# Patient Record
Sex: Female | Born: 1970 | Race: White | Hispanic: No | Smoking: Current every day smoker
Health system: Southern US, Community
[De-identification: ages and names within clinical notes are randomized; demographics above are authoritative.]

## PROBLEM LIST (undated history)

## (undated) ENCOUNTER — Emergency Department (HOSPITAL_COMMUNITY): Admission: EM | Payer: Self-pay | Source: Home / Self Care

## (undated) DIAGNOSIS — R5383 Other fatigue: Secondary | ICD-10-CM

## (undated) DIAGNOSIS — F419 Anxiety disorder, unspecified: Secondary | ICD-10-CM

## (undated) DIAGNOSIS — N939 Abnormal uterine and vaginal bleeding, unspecified: Principal | ICD-10-CM

## (undated) DIAGNOSIS — R232 Flushing: Secondary | ICD-10-CM

## (undated) HISTORY — DX: Other fatigue: R53.83

## (undated) HISTORY — PX: ECTOPIC PREGNANCY SURGERY: SHX613

## (undated) HISTORY — PX: TONSILLECTOMY: SUR1361

## (undated) HISTORY — PX: TUBAL LIGATION: SHX77

## (undated) HISTORY — DX: Flushing: R23.2

## (undated) HISTORY — DX: Abnormal uterine and vaginal bleeding, unspecified: N93.9

## (undated) HISTORY — DX: Anxiety disorder, unspecified: F41.9

---

## 2001-05-14 ENCOUNTER — Emergency Department (HOSPITAL_COMMUNITY): Admission: EM | Admit: 2001-05-14 | Discharge: 2001-05-14 | Payer: Self-pay | Admitting: Internal Medicine

## 2004-06-03 ENCOUNTER — Emergency Department (HOSPITAL_COMMUNITY): Admission: EM | Admit: 2004-06-03 | Discharge: 2004-06-04 | Payer: Self-pay | Admitting: Emergency Medicine

## 2004-06-25 ENCOUNTER — Emergency Department (HOSPITAL_COMMUNITY): Admission: EM | Admit: 2004-06-25 | Discharge: 2004-06-25 | Payer: Self-pay

## 2004-11-14 ENCOUNTER — Emergency Department (HOSPITAL_COMMUNITY): Admission: EM | Admit: 2004-11-14 | Discharge: 2004-11-14 | Payer: Self-pay | Admitting: Emergency Medicine

## 2007-02-04 ENCOUNTER — Emergency Department (HOSPITAL_COMMUNITY): Admission: EM | Admit: 2007-02-04 | Discharge: 2007-02-04 | Payer: Self-pay | Admitting: Emergency Medicine

## 2007-08-17 ENCOUNTER — Emergency Department (HOSPITAL_COMMUNITY): Admission: EM | Admit: 2007-08-17 | Discharge: 2007-08-17 | Payer: Self-pay | Admitting: Emergency Medicine

## 2007-11-22 ENCOUNTER — Emergency Department (HOSPITAL_COMMUNITY): Admission: EM | Admit: 2007-11-22 | Discharge: 2007-11-22 | Payer: Self-pay | Admitting: Emergency Medicine

## 2009-11-10 ENCOUNTER — Emergency Department (HOSPITAL_COMMUNITY): Admission: EM | Admit: 2009-11-10 | Discharge: 2009-11-10 | Payer: Self-pay | Admitting: Emergency Medicine

## 2010-04-02 LAB — URINE CULTURE

## 2010-04-02 LAB — URINALYSIS, ROUTINE W REFLEX MICROSCOPIC
Glucose, UA: NEGATIVE mg/dL
Leukocytes, UA: NEGATIVE
Protein, ur: NEGATIVE mg/dL
Specific Gravity, Urine: 1.025 (ref 1.005–1.030)
pH: 6 (ref 5.0–8.0)

## 2010-04-02 LAB — URINE MICROSCOPIC-ADD ON

## 2010-06-01 ENCOUNTER — Emergency Department (HOSPITAL_COMMUNITY)
Admission: EM | Admit: 2010-06-01 | Discharge: 2010-06-01 | Disposition: A | Discharge status: Home / Self Care | Payer: Self-pay | Attending: Emergency Medicine | Admitting: Emergency Medicine

## 2010-06-01 DIAGNOSIS — R21 Rash and other nonspecific skin eruption: Secondary | ICD-10-CM | POA: Insufficient documentation

## 2010-10-17 LAB — URINALYSIS, ROUTINE W REFLEX MICROSCOPIC
Glucose, UA: NEGATIVE
Nitrite: NEGATIVE
Specific Gravity, Urine: >1.03 — ABNORMAL HIGH
pH: 6

## 2010-10-17 LAB — URINE MICROSCOPIC-ADD ON

## 2012-01-15 ENCOUNTER — Emergency Department (HOSPITAL_COMMUNITY)
Admission: EM | Admit: 2012-01-15 | Discharge: 2012-01-15 | Disposition: A | Discharge status: Home / Self Care | Payer: Self-pay | Attending: Emergency Medicine | Admitting: Emergency Medicine

## 2012-01-15 ENCOUNTER — Encounter (HOSPITAL_COMMUNITY): Payer: Self-pay | Admitting: *Deleted

## 2012-01-15 ENCOUNTER — Emergency Department (HOSPITAL_COMMUNITY): Payer: Self-pay

## 2012-01-15 DIAGNOSIS — R079 Chest pain, unspecified: Secondary | ICD-10-CM

## 2012-01-15 DIAGNOSIS — R071 Chest pain on breathing: Secondary | ICD-10-CM | POA: Insufficient documentation

## 2012-01-15 LAB — CBC WITH DIFFERENTIAL/PLATELET
Basophils Absolute: 0.1 10*3/uL (ref 0.0–0.1)
Basophils Relative: 1 % (ref 0–1)
Eosinophils Absolute: 0 10*3/uL (ref 0.0–0.7)
Hemoglobin: 14.6 g/dL (ref 12.0–15.0)
MCH: 32.9 pg (ref 26.0–34.0)
MCHC: 34.3 g/dL (ref 30.0–36.0)
Monocytes Absolute: 0.7 10*3/uL (ref 0.1–1.0)
Monocytes Relative: 8 % (ref 3–12)
Neutro Abs: 5 10*3/uL (ref 1.7–7.7)
Neutrophils Relative %: 61 % (ref 43–77)
RDW: 12.8 % (ref 11.5–15.5)

## 2012-01-15 LAB — COMPREHENSIVE METABOLIC PANEL
AST: 17 U/L (ref 0–37)
Albumin: 3.6 g/dL (ref 3.5–5.2)
BUN: 14 mg/dL (ref 6–23)
Creatinine, Ser: 0.83 mg/dL (ref 0.50–1.10)
Potassium: 4.2 mEq/L (ref 3.5–5.1)
Total Protein: 6.8 g/dL (ref 6.0–8.3)

## 2012-01-15 LAB — TROPONIN I: Troponin I: <0.3 ng/mL (ref <0.30)

## 2012-01-15 MED ORDER — SODIUM CHLORIDE 0.9 % IV BOLUS (SEPSIS): 1000.0000 mL | Freq: Once | INTRAVENOUS | Status: DC

## 2012-01-15 MED ORDER — KETOROLAC TROMETHAMINE 30 MG/ML IJ SOLN
30.0000 mg | Freq: Once | INTRAMUSCULAR | Status: AC
  Administered 2012-01-15: 30 mg via INTRAVENOUS
  Filled 2012-01-15: qty 1

## 2012-01-15 MED ORDER — ONDANSETRON HCL 4 MG/2ML IJ SOLN: 4.0000 mg | Freq: Once | INTRAMUSCULAR | Status: DC

## 2012-01-15 NOTE — ED Notes (Signed)
Dull pain to chest began last night while taking a bath, intermittent, sharp shooting pains to area also. Pain to left breast area, just under left breast.

## 2012-01-15 NOTE — ED Provider Notes (Signed)
History    This chart was scribed for Geoffery Lyons, MD, MD by Smitty Pluck, ED Scribe. The patient was seen in room APA06 and the patient's care was started at 10:03 AM.   CSN: [id number]  Arrival date & time 01/15/12  0950      Chief Complaint  Patient presents with  . Chest Pain    (Consider location/radiation/quality/duration/timing/severity/associated sxs/prior treatment) The history is provided by the patient. No language interpreter was used.   Gabrielle Barrera is a 41 y.o. female who presents to the Emergency Department complaining of constant, moderate sharp left chest wall pain onset 1 day ago. Pt reports that she was bathing and raised her left arm then chest pain started. Breathing aggravates the pain. Pt denies hx of heart complications, DM, HTN and any other medical problems. She denies cough, SOB, diaphoresis, radiation of pain, fever, chills, nausea, vomiting and any other symptoms.    No past medical history on file.  No past surgical history on file.  No family history on file.  History  Substance Use Topics  . Smoking status: Not on file  . Smokeless tobacco: Not on file  . Alcohol Use: Not on file    OB History    No data available      Review of Systems  All other systems reviewed and are negative.    Allergies  Review of patient's allergies indicates not on file.  Home Medications  No current outpatient prescriptions on file.  BP 111/70  Pulse 92  Temp 97.7 F (36.5 C) (Oral)  Resp 16  Ht 5\' 3"  (1.6 m)  Wt 130 lb (58.968 kg)  BMI 23.03 kg/m2  SpO2 100%  LMP 12/25/2011  Physical Exam  Nursing note and vitals reviewed. Constitutional: She is oriented to person, place, and time. She appears well-developed and well-nourished. No distress.  HENT:  Head: Normocephalic and atraumatic.  Eyes: EOM are normal. Pupils are equal, round, and reactive to light.  Neck: Normal range of motion. Neck supple. No tracheal deviation present.    Cardiovascular: Normal rate, regular rhythm and normal heart sounds.   Pulmonary/Chest: Effort normal. No respiratory distress. She exhibits tenderness (mild left of sternum).  Abdominal: Soft. Bowel sounds are normal. She exhibits no distension. There is no tenderness. There is no rebound and no guarding.  Musculoskeletal: Normal range of motion.  Neurological: She is alert and oriented to person, place, and time.  Skin: Skin is warm and dry.  Psychiatric: She has a normal mood and affect. Her behavior is normal.    ED Course  Procedures (including critical care time) DIAGNOSTIC STUDIES: Oxygen Saturation is 100% on room air, normal by my interpretation.    COORDINATION OF CARE: 10:07 AM Discussed ED treatment with pt  10:07 AM Ordered:  Medications  OVER THE COUNTER MEDICATION (not administered)  ALPRAZolam (XANAX) 1 MG tablet (not administered)  ketorolac (TORADOL) 30 MG/ML injection 30 mg (30 mg Intravenous Given 01/15/12 1016)           Labs Reviewed  COMPREHENSIVE METABOLIC PANEL - Abnormal; Notable for the following:    Glucose, Bld 140 (*)     GFR calc non Af Amer 87 (*)     All other components within normal limits  CBC WITH DIFFERENTIAL  TROPONIN I   Dg Chest 2 View  01/15/2012  *RADIOLOGY REPORT*  Clinical Data: Chest pain, cough.  CHEST - 2 VIEW  Comparison: None.  Findings: Mild hyperinflation. Lungs clear.  Heart  size and pulmonary vascularity normal.  No effusion.  Visualized bones unremarkable.  IMPRESSION: No acute disease   Original Report Authenticated By: D. Andria Rhein, MD      No diagnosis found.   Date: 01/15/2012  Rate: 85  Rhythm: normal sinus rhythm  QRS Axis: normal  Intervals: normal  ST/T Wave abnormalities: nonspecific T wave changes  Conduction Disutrbances:none  Narrative Interpretation:   Old EKG Reviewed: none available    MDM  The workup does not suggest a cardiac etiology and the presentation and exam are more consistent  with a musculoskeletal etiology.  Will discharge with nsaids, return prn.      I personally performed the services described in this documentation, which was scribed in my presence. The recorded information has been reviewed and is accurate.      Geoffery Lyons, MD 01/15/12 1229

## 2013-03-16 ENCOUNTER — Encounter: Payer: Self-pay | Admitting: Adult Health

## 2013-03-22 ENCOUNTER — Encounter (INDEPENDENT_AMBULATORY_CARE_PROVIDER_SITE_OTHER): Payer: Self-pay

## 2013-03-22 ENCOUNTER — Ambulatory Visit (INDEPENDENT_AMBULATORY_CARE_PROVIDER_SITE_OTHER): Payer: BC Managed Care – PPO | Admitting: Adult Health

## 2013-03-22 ENCOUNTER — Encounter: Payer: Self-pay | Admitting: Adult Health

## 2013-03-22 VITALS — BP 110/54 | Ht 63.0 in | Wt 127.0 lb

## 2013-03-22 DIAGNOSIS — R5381 Other malaise: Secondary | ICD-10-CM

## 2013-03-22 DIAGNOSIS — R5383 Other fatigue: Secondary | ICD-10-CM

## 2013-03-22 DIAGNOSIS — N926 Irregular menstruation, unspecified: Secondary | ICD-10-CM

## 2013-03-22 DIAGNOSIS — N939 Abnormal uterine and vaginal bleeding, unspecified: Secondary | ICD-10-CM | POA: Insufficient documentation

## 2013-03-22 DIAGNOSIS — N951 Menopausal and female climacteric states: Secondary | ICD-10-CM

## 2013-03-22 DIAGNOSIS — R232 Flushing: Secondary | ICD-10-CM | POA: Insufficient documentation

## 2013-03-22 HISTORY — DX: Other fatigue: R53.83

## 2013-03-22 HISTORY — DX: Flushing: R23.2

## 2013-03-22 HISTORY — DX: Abnormal uterine and vaginal bleeding, unspecified: N93.9

## 2013-03-22 LAB — CBC
HCT: 42.5 % (ref 36.0–46.0)
Hemoglobin: 14.4 g/dL (ref 12.0–15.0)
MCH: 31.5 pg (ref 26.0–34.0)
MCHC: 33.9 g/dL (ref 30.0–36.0)
MCV: 93 fL (ref 78.0–100.0)
PLATELETS: 230 10*3/uL (ref 150–400)
RBC: 4.57 MIL/uL (ref 3.87–5.11)
RDW: 13.1 % (ref 11.5–15.5)
WBC: 7 10*3/uL (ref 4.0–10.5)

## 2013-03-22 LAB — COMPREHENSIVE METABOLIC PANEL
ALK PHOS: 41 U/L (ref 39–117)
ALT: 13 U/L (ref 0–35)
AST: 18 U/L (ref 0–37)
Albumin: 4.6 g/dL (ref 3.5–5.2)
BILIRUBIN TOTAL: 0.5 mg/dL (ref 0.2–1.2)
BUN: 18 mg/dL (ref 6–23)
CO2: 28 mEq/L (ref 19–32)
CREATININE: 0.82 mg/dL (ref 0.50–1.10)
Calcium: 9.7 mg/dL (ref 8.4–10.5)
Chloride: 105 mEq/L (ref 96–112)
Glucose, Bld: 79 mg/dL (ref 70–99)
Potassium: 4 mEq/L (ref 3.5–5.3)
SODIUM: 141 meq/L (ref 135–145)
TOTAL PROTEIN: 6.9 g/dL (ref 6.0–8.3)

## 2013-03-22 NOTE — Progress Notes (Signed)
Subjective:     Patient ID: Gabrielle Barrera A Mcghee, female   DOB: Feb 07, 1970, 43 y.o.   MRN: [id number]  HPI Gabrielle Barrera is a 43 year old white female in complaining of fatigue,hot flashes and history of having 2 periods per month for 6-7 months then no period x 2 months then had heavy period then LMP was 19 days long.Some cramps,has had BTL and is having sex.Has not had Pap in long time.She is smoker.   Review of Systems See HPI Reviewed past medical,surgical, social and family history. Reviewed medications and allergies.     Objective:   Physical Exam BP 110/54  Ht 5\' 3"  (1.6 m)  Wt 127 lb (57.607 kg)  BMI 22.50 kg/m2  LMP 02/25/2013   Skin warm and dry.Pelvic: external genitalia is normal in appearance, vagina: scant discharge without odor, no bleeding today, cervix:smooth and bulbous, uterus:anterior, normal size, shape and contour, non tender, no masses felt, adnexa: no masses or tenderness noted.  Discussed could be menopause, but will need US and labs first, will schedule pap after US results back.  Assessment:     AUB Hot flashes Fatigue     Plan:    Check CBC,CMP,TSH and FSH Return in 1 week for US and see me Review handout on DUB and menopause   Will talk in 24-48 hours about labs

## 2013-03-22 NOTE — Patient Instructions (Signed)
Dysfunctional Uterine Bleeding Normally, menstrual periods begin between ages 11 to 17 in young women. A normal menstrual cycle/period may begin every 23 days up to 35 days and lasts from 1 to 7 days. Around 12 to 14 days before your menstrual period starts, ovulation (ovary produces an egg) occurs. When counting the time between menstrual periods, count from the first day of bleeding of the previous period to the first day of bleeding of the next period. Dysfunctional (abnormal) uterine bleeding is bleeding that is different from a normal menstrual period. Your periods may come earlier or later than usual. They may be lighter, have blood clots or be heavier. You may have bleeding between periods, or you may skip one period or more. You may have bleeding after sexual intercourse, bleeding after menopause, or no menstrual period. CAUSES   Pregnancy (normal, miscarriage, tubal).  IUDs (intrauterine device, birth control).  Birth control pills.  Hormone treatment.  Menopause.  Infection of the cervix.  Blood clotting problems.  Infection of the inside lining of the uterus.  Endometriosis, inside lining of the uterus growing in the pelvis and other female organs.  Adhesions (scar tissue) inside the uterus.  Obesity or severe weight loss.  Uterine polyps inside the uterus.  Cancer of the vagina, cervix, or uterus.  Ovarian cysts or polycystic ovary syndrome.  Medical problems (diabetes, thyroid disease).  Uterine fibroids (noncancerous tumor).  Problems with your female hormones.  Endometrial hyperplasia, very thick lining and enlarged cells inside of the uterus.  Medicines that interfere with ovulation.  Radiation to the pelvis or abdomen.  Chemotherapy. DIAGNOSIS   Your doctor will discuss the history of your menstrual periods, medicines you are taking, changes in your weight, stress in your life, and any medical problems you may have.  Your doctor will do a physical  and pelvic examination.  Your doctor may want to perform certain tests to make a diagnosis, such as:  Pap test.  Blood tests.  Cultures for infection.  CT scan.  Ultrasound.  Hysteroscopy.  Laparoscopy.  MRI.  Hysterosalpingography.  D and C.  Endometrial biopsy. TREATMENT  Treatment will depend on the cause of the dysfunctional uterine bleeding (DUB). Treatment may include:  Observing your menstrual periods for a couple of months.  Prescribing medicines for medical problems, including:  Antibiotics.  Hormones.  Birth control pills.  Removing an IUD (intrauterine device, birth control).  Surgery:  D and C (scrape and remove tissue from inside the uterus).  Laparoscopy (examine inside the abdomen with a lighted tube).  Uterine ablation (destroy lining of the uterus with electrical current, laser, heat, or freezing).  Hysteroscopy (examine cervix and uterus with a lighted tube).  Hysterectomy (remove the uterus). HOME CARE INSTRUCTIONS   If medicines were prescribed, take exactly as directed. Do not change or switch medicines without consulting your caregiver.  Long term heavy bleeding may result in iron deficiency. Your caregiver may have prescribed iron pills. They help replace the iron that your body lost from heavy bleeding. Take exactly as directed.  Do not take aspirin or medicines that contain aspirin one week before or during your menstrual period. Aspirin may make the bleeding worse.  If you need to change your sanitary pad or tampon more than once every 2 hours, stay in bed with your feet elevated and a cold pack on your lower abdomen. Rest as much as possible, until the bleeding stops or slows down.  Eat well-balanced meals. Eat foods high in iron. Examples   are:  Leafy green vegetables.  Whole-grain breads and cereals.  Eggs.  Meat.  Liver.  Do not try to lose weight until the abnormal bleeding has stopped and your blood iron level is  back to normal. Do not lift more than ten pounds or do strenuous activities when you are bleeding.  For a couple of months, make note on your calendar, marking the start and ending of your period, and the type of bleeding (light, medium, heavy, spotting, clots or missed periods). This is for your caregiver to better evaluate your problem. SEEK MEDICAL CARE IF:   You develop nausea (feeling sick to your stomach) and vomiting, dizziness, or diarrhea while you are taking your medicine.  You are getting lightheaded or weak.  You have any problems that may be related to the medicine you are taking.  You develop pain with your DUB.  You want to remove your IUD.  You want to stop or change your birth control pills or hormones.  You have any type of abnormal bleeding mentioned above.  You are over 42 years old and have not had a menstrual period yet.  You are 43 years old and you are still having menstrual periods.  You have any of the symptoms mentioned above.  You develop a rash. SEEK IMMEDIATE MEDICAL CARE IF:   An oral temperature above 102 F (38.9 C) develops.  You develop chills.  You are changing your sanitary pad or tampon more than once an hour.  You develop abdominal pain.  You pass out or faint. Document Released: 01/03/2000 Document Revised: 03/30/2011 Document Reviewed: 12/04/2008 [facility] Patient Information 2014 [place], [place]. Menopause Menopause is the normal time of life when menstrual periods stop completely. Menopause is complete when you have missed 12 consecutive menstrual periods. It usually occurs between the ages of 48 years and 55 years. Very rarely does a woman develop menopause before the age of 40 years. At menopause, your ovaries stop producing the female hormones estrogen and progesterone. This can cause undesirable symptoms and also affect your health. Sometimes the symptoms may occur 4 5 years before the menopause begins. There is no relationship  between menopause and:  Oral contraceptives.  Number of children you had.  Race.  The age your menstrual periods started (menarche). Heavy smokers and very thin women may develop menopause earlier in life. CAUSES  The ovaries stop producing the female hormones estrogen and progesterone.  Other causes include:  Surgery to remove both ovaries.  The ovaries stop functioning for no known reason.  Tumors of the pituitary gland in the brain.  Medical disease that affects the ovaries and hormone production.  Radiation treatment to the abdomen or pelvis.  Chemotherapy that affects the ovaries. SYMPTOMS   Hot flashes.  Night sweats.  Decrease in sex drive.  Vaginal dryness and thinning of the vagina causing painful intercourse.  Dryness of the skin and developing wrinkles.  Headaches.  Tiredness.  Irritability.  Memory problems.  Weight gain.  Bladder infections.  Hair growth of the face and chest.  Infertility. More serious symptoms include:  Loss of bone (osteoporosis) causing breaks (fractures).  Depression.  Hardening and narrowing of the arteries (atherosclerosis) causing heart attacks and strokes. DIAGNOSIS   When the menstrual periods have stopped for 12 straight months.  Physical exam.  Hormone studies of the blood. TREATMENT  There are many treatment choices and nearly as many questions about them. The decisions to treat or not to treat menopausal changes is an  individual choice made with your health care provider. Your health care provider can discuss the treatments with you. Together, you can decide which treatment will work best for you. Your treatment choices may include:   Hormone therapy (estrogen and progesterone).  Non-hormonal medicines.  Treating the individual symptoms with medicine (for example antidepressants for depression).  Herbal medicines that may help specific symptoms.  Counseling by a psychiatrist or  psychologist.  Group therapy.  Lifestyle changes including:  Eating healthy.  Regular exercise.  Limiting caffeine and alcohol.  Stress management and meditation.  No treatment. HOME CARE INSTRUCTIONS   Take the medicine your health care provider gives you as directed.  Get plenty of sleep and rest.  Exercise regularly.  Eat a diet that contains calcium (good for the bones) and soy products (acts like estrogen hormone).  Avoid alcoholic beverages.  Do not smoke.  If you have hot flashes, dress in layers.  Take supplements, calcium, and vitamin D to strengthen bones.  You can use over-the-counter lubricants or moisturizers for vaginal dryness.  Group therapy is sometimes very helpful.  Acupuncture may be helpful in some cases. SEEK MEDICAL CARE IF:   You are not sure you are in menopause.  You are having menopausal symptoms and need advice and treatment.  You are still having menstrual periods after age 42 years.  You have pain with intercourse.  Menopause is complete (no menstrual period for 12 months) and you develop vaginal bleeding.  You need a referral to a specialist (gynecologist, psychiatrist, or psychologist) for treatment. SEEK IMMEDIATE MEDICAL CARE IF:   You have severe depression.  You have excessive vaginal bleeding.  You fell and think you have a broken bone.  You have pain when you urinate.  You develop leg or chest pain.  You have a fast pounding heart beat (palpitations).  You have severe headaches.  You develop vision problems.  You feel a lump in your breast.  You have abdominal pain or severe indigestion. Document Released: 03/28/2003 Document Revised: 09/07/2012 Document Reviewed: 08/04/2012 [facility] Patient Information 2014 [place]. Return in 1 week for US and see me Call for labs

## 2013-03-23 ENCOUNTER — Telehealth: Payer: Self-pay | Admitting: Adult Health

## 2013-03-23 LAB — TSH: TSH: 1.062 u[IU]/mL (ref 0.350–4.500)

## 2013-03-23 LAB — FOLLICLE STIMULATING HORMONE: FSH: 73.7 m[IU]/mL

## 2013-03-23 NOTE — Telephone Encounter (Signed)
Pt aware of labs and that FSH 73.7, keep appt next week for US, will discuss more then

## 2013-03-29 ENCOUNTER — Encounter: Payer: Self-pay | Admitting: Adult Health

## 2013-03-29 ENCOUNTER — Ambulatory Visit (INDEPENDENT_AMBULATORY_CARE_PROVIDER_SITE_OTHER): Payer: BC Managed Care – PPO | Admitting: Adult Health

## 2013-03-29 ENCOUNTER — Ambulatory Visit (INDEPENDENT_AMBULATORY_CARE_PROVIDER_SITE_OTHER): Payer: BC Managed Care – PPO

## 2013-03-29 VITALS — BP 100/70 | Ht 63.0 in | Wt 126.0 lb

## 2013-03-29 DIAGNOSIS — N926 Irregular menstruation, unspecified: Secondary | ICD-10-CM

## 2013-03-29 DIAGNOSIS — N939 Abnormal uterine and vaginal bleeding, unspecified: Secondary | ICD-10-CM

## 2013-03-29 MED ORDER — MEGESTROL ACETATE 40 MG PO TABS: ORAL_TABLET | ORAL | Status: DC

## 2013-03-29 NOTE — Progress Notes (Signed)
Subjective:     Patient ID: Gabrielle Barrera, female   DOB: [birth year], 43 y.o.   MRN: [id number]  HPI Gabrielle Barrera is a 43 year old white female back for US for AUB.  Review of Systems See HPI Reviewed past medical,surgical, social and family history. Reviewed medications and allergies.     Objective:   Physical Exam BP 100/70  Ht 5\' 3"  (1.6 m)  Wt 126 lb (57.153 kg)  BMI 22.33 kg/m2  LMP 02/25/2013   Reviewed US with pt. Uterus 7.9 x 4.6 x 4.0 cm, no myometrial masses noted, Nabothian cyst noted = 10.258mm  Endometrium 3.5 mm, symmetrical,  Right ovary 1.7 x 1.2 x 1.0 cm,  Left ovary 2.6 x 1.6 x 1.5 cm,  No free fluid or adnexal masses noted abdominally or vaginally  Technician Comments:  Anteverted uterus noted, 10+mm Nabothian cyst noted, Endom=3.875mm, bilateral adnexa/ovaries appears WNL. FSH 73.7,TSH 1.062 and CBC normal.Pt aware that she is perimenopausal.  Assessment:     AUB    Plan:     Rx megace 40 mg 1 daily, disp #30 with 3 refills  Return in 4 weeks for pap and physical.

## 2013-03-29 NOTE — Patient Instructions (Signed)
Take megace 1 daily Return in 4 weeks for pap and physcial

## 2013-04-13 ENCOUNTER — Encounter (HOSPITAL_COMMUNITY): Payer: Self-pay | Admitting: Emergency Medicine

## 2013-04-13 DIAGNOSIS — Z79899 Other long term (current) drug therapy: Secondary | ICD-10-CM | POA: Insufficient documentation

## 2013-04-13 DIAGNOSIS — L509 Urticaria, unspecified: Secondary | ICD-10-CM | POA: Insufficient documentation

## 2013-04-13 DIAGNOSIS — N390 Urinary tract infection, site not specified: Secondary | ICD-10-CM | POA: Insufficient documentation

## 2013-04-13 DIAGNOSIS — F17200 Nicotine dependence, unspecified, uncomplicated: Secondary | ICD-10-CM | POA: Insufficient documentation

## 2013-04-13 NOTE — ED Notes (Signed)
Onset of hives yesterday, unknown allergen

## 2013-04-14 ENCOUNTER — Emergency Department (HOSPITAL_COMMUNITY)
Admission: EM | Admit: 2013-04-14 | Discharge: 2013-04-14 | Disposition: A | Discharge status: Home / Self Care | Payer: BC Managed Care – PPO | Attending: Emergency Medicine | Admitting: Emergency Medicine

## 2013-04-14 DIAGNOSIS — L509 Urticaria, unspecified: Secondary | ICD-10-CM

## 2013-04-14 DIAGNOSIS — N390 Urinary tract infection, site not specified: Secondary | ICD-10-CM

## 2013-04-14 LAB — URINE MICROSCOPIC-ADD ON

## 2013-04-14 LAB — URINALYSIS, ROUTINE W REFLEX MICROSCOPIC
BILIRUBIN URINE: NEGATIVE
Glucose, UA: NEGATIVE mg/dL
Ketones, ur: NEGATIVE mg/dL
Leukocytes, UA: NEGATIVE
NITRITE: POSITIVE — AB
PH: 6 (ref 5.0–8.0)
Protein, ur: NEGATIVE mg/dL
SPECIFIC GRAVITY, URINE: 1.025 (ref 1.005–1.030)
Urobilinogen, UA: 0.2 mg/dL (ref 0.0–1.0)

## 2013-04-14 MED ORDER — PREDNISONE 10 MG PO TABS
ORAL_TABLET | ORAL | Status: AC
  Filled 2013-04-14: qty 1

## 2013-04-14 MED ORDER — FAMOTIDINE 20 MG PO TABS
20.0000 mg | ORAL_TABLET | Freq: Once | ORAL | Status: AC
  Administered 2013-04-14: 20 mg via ORAL

## 2013-04-14 MED ORDER — CEPHALEXIN 500 MG PO CAPS: 500.0000 mg | ORAL_CAPSULE | Freq: Four times a day (QID) | ORAL | Status: DC

## 2013-04-14 MED ORDER — FAMOTIDINE 20 MG PO TABS
ORAL_TABLET | ORAL | Status: AC
  Administered 2013-04-14: 20 mg via ORAL
  Filled 2013-04-14: qty 1

## 2013-04-14 MED ORDER — PREDNISONE 50 MG PO TABS
60.0000 mg | ORAL_TABLET | Freq: Once | ORAL | Status: AC
  Administered 2013-04-14: 60 mg via ORAL

## 2013-04-14 MED ORDER — CEPHALEXIN 500 MG PO CAPS
ORAL_CAPSULE | ORAL | Status: AC
  Filled 2013-04-14: qty 2

## 2013-04-14 MED ORDER — CEPHALEXIN 500 MG PO CAPS
1000.0000 mg | ORAL_CAPSULE | Freq: Once | ORAL | Status: AC
  Administered 2013-04-14: 1000 mg via ORAL

## 2013-04-14 MED ORDER — PREDNISONE 50 MG PO TABS
ORAL_TABLET | ORAL | Status: AC
  Filled 2013-04-14: qty 1

## 2013-04-14 MED ORDER — PREDNISONE 50 MG PO TABS: 50.0000 mg | ORAL_TABLET | Freq: Every day | ORAL | Status: DC

## 2013-04-14 NOTE — ED Notes (Signed)
Pt alert & oriented x4, stable gait. Patient given discharge instructions, paperwork & prescription(s). Patient  instructed to stop at the registration desk to finish any additional paperwork. Patient verbalized understanding. Pt left department w/ no further questions. 

## 2013-04-14 NOTE — Discharge Instructions (Signed)
Take Claritin or Zyrtec once a day. Take Zantac or Pepcid twice a day. Take Benadryl as needed for itching not controlled by the other medication.  Hives Hives are itchy, red, swollen areas of the skin. They can vary in size and location on your body. Hives can come and go for hours or several days (acute hives) or for several weeks (chronic hives). Hives do not spread from person to person (noncontagious). They may get worse with scratching, exercise, and emotional stress. CAUSES   Allergic reaction to food, additives, or drugs.  Infections, including the common cold.  Illness, such as vasculitis, lupus, or thyroid disease.  Exposure to sunlight, heat, or cold.  Exercise.  Stress.  Contact with chemicals. SYMPTOMS   Red or white swollen patches on the skin. The patches may change size, shape, and location quickly and repeatedly.  Itching.  Swelling of the hands, feet, and face. This may occur if hives develop deeper in the skin. DIAGNOSIS  Your caregiver can usually tell what is wrong by performing a physical exam. Skin or blood tests may also be done to determine the cause of your hives. In some cases, the cause cannot be determined. TREATMENT  Mild cases usually get better with medicines such as antihistamines. Severe cases may require an emergency epinephrine injection. If the cause of your hives is known, treatment includes avoiding that trigger.  HOME CARE INSTRUCTIONS   Avoid causes that trigger your hives.  Take antihistamines as directed by your caregiver to reduce the severity of your hives. Non-sedating or low-sedating antihistamines are usually recommended. Do not drive while taking an antihistamine.  Take any other medicines prescribed for itching as directed by your caregiver.  Wear loose-fitting clothing.  Keep all follow-up appointments as directed by your caregiver. SEEK MEDICAL CARE IF:   You have persistent or severe itching that is not relieved with  medicine.  You have painful or swollen joints. SEEK IMMEDIATE MEDICAL CARE IF:   You have a fever.  Your tongue or lips are swollen.  You have trouble breathing or swallowing.  You feel tightness in the throat or chest.  You have abdominal pain. These problems may be the first sign of a life-threatening allergic reaction. Call your local emergency services (911 in [place] MAKE SURE YOU:   Understand these instructions.  Will watch your condition.  Will get help right away if you are not doing well or get worse. Document Released: 01/05/2005 Document Revised: 07/07/2011 Document Reviewed: 03/31/2011 [facility] Patient Information 2014 [place], [place].  Urinary Tract Infection Urinary tract infections (UTIs) can develop anywhere along your urinary tract. Your urinary tract is your body's drainage system for removing wastes and extra water. Your urinary tract includes two kidneys, two ureters, a bladder, and a urethra. Your kidneys are a pair of bean-shaped organs. Each kidney is about the size of your fist. They are located below your ribs, one on each side of your spine. CAUSES Infections are caused by microbes, which are microscopic organisms, including fungi, viruses, and bacteria. These organisms are so small that they can only be seen through a microscope. Bacteria are the microbes that most commonly cause UTIs. SYMPTOMS  Symptoms of UTIs may vary by age and gender of the patient and by the location of the infection. Symptoms in young women typically include a frequent and intense urge to urinate and a painful, burning feeling in the bladder or urethra during urination. Older women and men are more likely to be tired, shaky,  and weak and have muscle aches and abdominal pain. A fever may mean the infection is in your kidneys. Other symptoms of a kidney infection include pain in your back or sides below the ribs, nausea, and vomiting. DIAGNOSIS To diagnose a UTI, your caregiver will ask  you about your symptoms. Your caregiver also will ask to provide a urine sample. The urine sample will be tested for bacteria and white blood cells. White blood cells are made by your body to help fight infection. TREATMENT  Typically, UTIs can be treated with medication. Because most UTIs are caused by a bacterial infection, they usually can be treated with the use of antibiotics. The choice of antibiotic and length of treatment depend on your symptoms and the type of bacteria causing your infection. HOME CARE INSTRUCTIONS  If you were prescribed antibiotics, take them exactly as your caregiver instructs you. Finish the medication even if you feel better after you have only taken some of the medication.  Drink enough water and fluids to keep your urine clear or pale yellow.  Avoid caffeine, tea, and carbonated beverages. They tend to irritate your bladder.  Empty your bladder often. Avoid holding urine for long periods of time.  Empty your bladder before and after sexual intercourse.  After a bowel movement, women should cleanse from front to back. Use each tissue only once. SEEK MEDICAL CARE IF:   You have back pain.  You develop a fever.  Your symptoms do not begin to resolve within 3 days. SEEK IMMEDIATE MEDICAL CARE IF:   You have severe back pain or lower abdominal pain.  You develop chills.  You have nausea or vomiting.  You have continued burning or discomfort with urination. MAKE SURE YOU:   Understand these instructions.  Will watch your condition.  Will get help right away if you are not doing well or get worse. Document Released: 10/15/2004 Document Revised: 07/07/2011 Document Reviewed: 02/13/2011 [facility] Patient Information 2014 [place], [place].  Prednisone tablets What is this medicine? PREDNISONE (PRED ni sone) is a corticosteroid. It is commonly used to treat inflammation of the skin, joints, lungs, and other organs. Common conditions treated include  asthma, allergies, and arthritis. It is also used for other conditions, such as blood disorders and diseases of the adrenal glands. This medicine may be used for other purposes; ask your health care provider or pharmacist if you have questions. COMMON BRAND NAME(S): Deltasone, Predone, Sterapred DS, Sterapred What should I tell my health care provider before I take this medicine? They need to know if you have any of these conditions: -Cushing's syndrome -diabetes -glaucoma -heart disease -high blood pressure -infection (especially a virus infection such as chickenpox, cold sores, or herpes) -kidney disease -liver disease -mental illness -myasthenia gravis -osteoporosis -seizures -stomach or intestine problems -thyroid disease -an unusual or allergic reaction to lactose, prednisone, other medicines, foods, dyes, or preservatives -pregnant or trying to get pregnant -breast-feeding How should I use this medicine? Take this medicine by mouth with a glass of water. Follow the directions on the prescription label. Take this medicine with food. If you are taking this medicine once a day, take it in the morning. Do not take more medicine than you are told to take. Do not suddenly stop taking your medicine because you may develop a severe reaction. Your doctor will tell you how much medicine to take. If your doctor wants you to stop the medicine, the dose may be slowly lowered over time to avoid any side  effects. Talk to your pediatrician regarding the use of this medicine in children. Special care may be needed. Overdosage: If you think you have taken too much of this medicine contact a poison control center or emergency room at once. NOTE: This medicine is only for you. Do not share this medicine with others. What if I miss a dose? If you miss a dose, take it as soon as you can. If it is almost time for your next dose, talk to your doctor or health care professional. You may need to miss a dose  or take an extra dose. Do not take double or extra doses without advice. What may interact with this medicine? Do not take this medicine with any of the following medications: -metyrapone -mifepristone This medicine may also interact with the following medications: -aminoglutethimide -amphotericin B -aspirin and aspirin-like medicines -barbiturates -certain medicines for diabetes, like glipizide or glyburide -cholestyramine -cholinesterase inhibitors -cyclosporine -digoxin -diuretics -ephedrine -female hormones, like estrogens and birth control pills -isoniazid -ketoconazole -NSAIDS, medicines for pain and inflammation, like ibuprofen or naproxen -phenytoin -rifampin -toxoids -vaccines -warfarin This list may not describe all possible interactions. Give your health care provider a list of all the medicines, herbs, non-prescription drugs, or dietary supplements you use. Also tell them if you smoke, drink alcohol, or use illegal drugs. Some items may interact with your medicine. What should I watch for while using this medicine? Visit your doctor or health care professional for regular checks on your progress. If you are taking this medicine over a prolonged period, carry an identification card with your name and address, the type and dose of your medicine, and your doctor's name and address. This medicine may increase your risk of getting an infection. Tell your doctor or health care professional if you are around anyone with measles or chickenpox, or if you develop sores or blisters that do not heal properly. If you are going to have surgery, tell your doctor or health care professional that you have taken this medicine within the last twelve months. Ask your doctor or health care professional about your diet. You may need to lower the amount of salt you eat. This medicine may affect blood sugar levels. If you have diabetes, check with your doctor or health care professional before  you change your diet or the dose of your diabetic medicine. What side effects may I notice from receiving this medicine? Side effects that you should report to your doctor or health care professional as soon as possible: -allergic reactions like skin rash, itching or hives, swelling of the face, lips, or tongue -changes in emotions or moods -changes in vision -depressed mood -eye pain -fever or chills, cough, sore throat, pain or difficulty passing urine -increased thirst -swelling of ankles, feet Side effects that usually do not require medical attention (report to your doctor or health care professional if they continue or are bothersome): -confusion, excitement, restlessness -headache -nausea, vomiting -skin problems, acne, thin and shiny skin -trouble sleeping -weight gain This list may not describe all possible side effects. Call your doctor for medical advice about side effects. You may report side effects to FDA at 1-800-FDA-1088. Where should I keep my medicine? Keep out of the reach of children. Store at room temperature between 15 and 30 degrees C (59 and 86 degrees F). Protect from light. Keep container tightly closed. Throw away any unused medicine after the expiration date. NOTE: This sheet is a summary. It may not cover all possible information. If you  have questions about this medicine, talk to your doctor, pharmacist, or health care provider.  2014, Elsevier/[employer] Standard. (2010-08-21 10:57:14)  Cephalexin tablets or capsules What is this medicine? CEPHALEXIN (sef a LEX in) is a cephalosporin antibiotic. It is used to treat certain kinds of bacterial infections It will not work for colds, flu, or other viral infections. This medicine may be used for other purposes; ask your health care provider or pharmacist if you have questions. COMMON BRAND NAME(S): Biocef, Keflex, Keftab What should I tell my health care provider before I take this medicine? They need to know if  you have any of these conditions: -kidney disease -stomach or intestine problems, especially colitis -an unusual or allergic reaction to cephalexin, other cephalosporins, penicillins, other antibiotics, medicines, foods, dyes or preservatives -pregnant or trying to get pregnant -breast-feeding How should I use this medicine? Take this medicine by mouth with a full glass of water. Follow the directions on the prescription label. This medicine can be taken with or without food. Take your medicine at regular intervals. Do not take your medicine more often than directed. Take all of your medicine as directed even if you think you are better. Do not skip doses or stop your medicine early. Talk to your pediatrician regarding the use of this medicine in children. While this drug may be prescribed for selected conditions, precautions do apply. Overdosage: If you think you have taken too much of this medicine contact a poison control center or emergency room at once. NOTE: This medicine is only for you. Do not share this medicine with others. What if I miss a dose? If you miss a dose, take it as soon as you can. If it is almost time for your next dose, take only that dose. Do not take double or extra doses. There should be at least 4 to 6 hours between doses. What may interact with this medicine? -probenecid -some other antibiotics This list may not describe all possible interactions. Give your health care provider a list of all the medicines, herbs, non-prescription drugs, or dietary supplements you use. Also tell them if you smoke, drink alcohol, or use illegal drugs. Some items may interact with your medicine. What should I watch for while using this medicine? Tell your doctor or health care professional if your symptoms do not begin to improve in a few days. Do not treat diarrhea with over the counter products. Contact your doctor if you have diarrhea that lasts more than 2 days or if it is severe and  watery. If you have diabetes, you may get a false-positive result for sugar in your urine. Check with your doctor or health care professional. What side effects may I notice from receiving this medicine? Side effects that you should report to your doctor or health care professional as soon as possible: -allergic reactions like skin rash, itching or hives, swelling of the face, lips, or tongue -breathing problems -pain or trouble passing urine -redness, blistering, peeling or loosening of the skin, including inside the mouth -severe or watery diarrhea -unusually weak or tired -yellowing of the eyes, skin Side effects that usually do not require medical attention (report to your doctor or health care professional if they continue or are bothersome): -gas or heartburn -genital or anal irritation -headache -joint or muscle pain -nausea, vomiting This list may not describe all possible side effects. Call your doctor for medical advice about side effects. You may report side effects to FDA at 1-800-FDA-1088. Where should I keep  my medicine? Keep out of the reach of children. Store at room temperature between 59 and 86 degrees F (15 and 30 degrees C). Throw away any unused medicine after the expiration date. NOTE: This sheet is a summary. It may not cover all possible information. If you have questions about this medicine, talk to your doctor, pharmacist, or health care provider.  2014, Elsevier/Gold Standard. (2007-04-11 17:09:13)

## 2013-04-14 NOTE — ED Provider Notes (Signed)
CSN: [id number]     Arrival date & time 04/13/13  2301 History   First MD Initiated Contact with Patient 04/14/13 0244     Chief Complaint  Patient presents with  . Urticaria     (Consider location/radiation/quality/duration/timing/severity/associated sxs/prior Treatment) Patient is a 43 y.o. female presenting with urticaria. The history is provided by the patient.  Urticaria  She started developing hives yesterday and has been getting progressively worse. Hives are present over entire body. She is complaining of severe itching. She denies any difficulty breathing or swallowing. She took diphenhydramine which gave temporary relief but to also put her to sleep. She denies any new medications, laundry detergents, fabric softeners, deodorants, cosmetics, foods, pets, etc. She had one prior episode of urticaria for which she felt was related to stress. She is also concerned about possible UTI. She's had some pain in her lower back but denie  urinary urgency, frequency, tenesmus, dysuria. However, back pain has been a presenting symptom of UTI in the past. Past Medical History  Diagnosis Date  . Abnormal uterine bleeding (AUB) 03/22/2013  . Hot flashes 03/22/2013  . Fatigue 03/22/2013   Past Surgical History  Procedure Laterality Date  . Tonsillectomy    . Cesarean section    . Ectopic pregnancy surgery    . Tubal ligation     Family History  Problem Relation Age of Onset  . Cancer Mother     skin   . Cancer Maternal Grandmother    History  Substance Use Topics  . Smoking status: Current Every Day Smoker -- 1.00 packs/day    Types: Cigarettes  . Smokeless tobacco: Never Used  . Alcohol Use: Yes     Comment: Occ   OB History   Grav Para Term Preterm Abortions TAB SAB Ect Mult Living   3 3        3      Review of Systems  All other systems reviewed and are negative.      Allergies  Review of patient's allergies indicates no known allergies.  Home Medications   Current  Outpatient Rx  Name  Route  Sig  Dispense  Refill  . ALPRAZolam (XANAX) 1 MG tablet   Oral   Take 0.5 mg by mouth daily as needed. For anxiety         . megestrol (MEGACE) 40 MG tablet      Take 1 daily   30 tablet   3   . OVER THE COUNTER MEDICATION   Oral   Take 1 tablet by mouth daily. Women's one a day multi vitamin          BP 113/59  Pulse 90  Temp(Src) 98.1 F (36.7 C) (Oral)  Resp 16  Ht 5\' 3"  (1.6 m)  Wt 127 lb (57.607 kg)  BMI 22.50 kg/m2  SpO2 98%  LMP 02/25/2013 Physical Exam  Nursing note and vitals reviewed.  43 year old female, resting comfortably and in no acute distress. Vital signs are  normal. Oxygen saturation is 98%, which is normal. Head is normocephalic and atraumatic. PERRLA, EOMI. Oropharynx is clear. there are no mucosal lesions. Neck is nontender and supple without adenopathy or JVD. Back is nontender and there is no CVA tenderness. Lungs are clear without rales, wheezes, or rhonchi. Chest is nontender. Heart has regular rate and rhythm without murmur. Abdomen is soft, flat, nontender without masses or hepatosplenomegaly and peristalsis is normoactive. Extremities have no cyanosis or edema, full range of motion is  present. Skin is warm and dry. Diffuse urticarial rash is present. Neurologic: Mental status is normal, cranial nerves are intact, there are no motor or sensory deficits.  ED Course  Procedures (including critical care time) Labs Review Results for orders placed during the hospital encounter of 04/14/13  URINALYSIS, ROUTINE W REFLEX MICROSCOPIC      Result Value Ref Range   Color, Urine YELLOW  YELLOW   APPearance HAZY (*) CLEAR   Specific Gravity, Urine 1.025  1.005 - 1.030   pH 6.0  5.0 - 8.0   Glucose, UA NEGATIVE  NEGATIVE mg/dL   Hgb urine dipstick TRACE (*) NEGATIVE   Bilirubin Urine NEGATIVE  NEGATIVE   Ketones, ur NEGATIVE  NEGATIVE mg/dL   Protein, ur NEGATIVE  NEGATIVE mg/dL   Urobilinogen, UA 0.2  0.0 - 1.0  mg/dL   Nitrite POSITIVE (*) NEGATIVE   Leukocytes, UA NEGATIVE  NEGATIVE  URINE MICROSCOPIC-ADD ON      Result Value Ref Range   Squamous Epithelial / LPF FEW (*) RARE   WBC, UA 3-6  <3 WBC/hpf   RBC / HPF 3-6  <3 RBC/hpf   Bacteria, UA MANY (*) RARE   MDM   Final diagnoses:  Urticaria  Urinary tract infection    Urticaria of uncertain cause. Urinalysis will be obtained to rule out UTI and she will be discharged with prescriptions for prednisone. She is advised to use over-the-counter H2 blocker such as ranitidine or famotidine and also non-sedating antihistamines such as certrizine or loratidine.  Urinalysis confirms the presence of urinary tract infection with positive nitrite. She is also given prescription for cephalexin.  Dione Booze, MD 04/14/13 [id number]

## 2013-04-26 ENCOUNTER — Other Ambulatory Visit (HOSPITAL_COMMUNITY)
Admission: RE | Admit: 2013-04-26 | Discharge: 2013-04-26 | Disposition: A | Discharge status: Home / Self Care | Payer: BC Managed Care – PPO | Source: Ambulatory Visit | Attending: Adult Health | Admitting: Adult Health

## 2013-04-26 ENCOUNTER — Ambulatory Visit (INDEPENDENT_AMBULATORY_CARE_PROVIDER_SITE_OTHER): Payer: BC Managed Care – PPO | Admitting: Adult Health

## 2013-04-26 ENCOUNTER — Encounter: Payer: Self-pay | Admitting: Adult Health

## 2013-04-26 VITALS — BP 98/58 | HR 72 | Ht 63.5 in | Wt 130.0 lb

## 2013-04-26 DIAGNOSIS — Z1151 Encounter for screening for human papillomavirus (HPV): Secondary | ICD-10-CM | POA: Insufficient documentation

## 2013-04-26 DIAGNOSIS — F419 Anxiety disorder, unspecified: Secondary | ICD-10-CM | POA: Insufficient documentation

## 2013-04-26 DIAGNOSIS — Z124 Encounter for screening for malignant neoplasm of cervix: Secondary | ICD-10-CM | POA: Insufficient documentation

## 2013-04-26 DIAGNOSIS — R87810 Cervical high risk human papillomavirus (HPV) DNA test positive: Secondary | ICD-10-CM | POA: Insufficient documentation

## 2013-04-26 DIAGNOSIS — Z01419 Encounter for gynecological examination (general) (routine) without abnormal findings: Secondary | ICD-10-CM

## 2013-04-26 HISTORY — DX: Anxiety disorder, unspecified: F41.9

## 2013-04-26 MED ORDER — CITALOPRAM HYDROBROMIDE 20 MG PO TABS: 20.0000 mg | ORAL_TABLET | Freq: Every day | ORAL | Status: DC

## 2013-04-26 NOTE — Patient Instructions (Signed)
Generalized Anxiety Disorder Generalized anxiety disorder (GAD) is a mental disorder. It interferes with life functions, including relationships, work, and school. GAD is different from normal anxiety, which everyone experiences at some point in their lives in response to specific life events and activities. Normal anxiety actually helps us prepare for and get through these life events and activities. Normal anxiety goes away after the event or activity is over.  GAD causes anxiety that is not necessarily related to specific events or activities. It also causes excess anxiety in proportion to specific events or activities. The anxiety associated with GAD is also difficult to control. GAD can vary from mild to severe. People with severe GAD can have intense waves of anxiety with physical symptoms (panic attacks).  SYMPTOMS The anxiety and worry associated with GAD are difficult to control. This anxiety and worry are related to many life events and activities and also occur more days than not for 6 months or longer. People with GAD also have three or more of the following symptoms (one or more in children):  Restlessness.   Fatigue.  Difficulty concentrating.   Irritability.  Muscle tension.  Difficulty sleeping or unsatisfying sleep. DIAGNOSIS GAD is diagnosed through an assessment by your caregiver. Your caregiver will ask you questions aboutyour mood,physical symptoms, and events in your life. Your caregiver may ask you about your medical history and use of alcohol or drugs, including prescription medications. Your caregiver may also do a physical exam and blood tests. Certain medical conditions and the use of certain substances can cause symptoms similar to those associated with GAD. Your caregiver may refer you to a mental health specialist for further evaluation. TREATMENT The following therapies are usually used to treat GAD:   Medication Antidepressant medication usually is  prescribed for long-term daily control. Antianxiety medications may be added in severe cases, especially when panic attacks occur.   Talk therapy (psychotherapy) Certain types of talk therapy can be helpful in treating GAD by providing support, education, and guidance. A form of talk therapy called cognitive behavioral therapy can teach you healthy ways to think about and react to daily life events and activities.  Stress managementtechniques These include yoga, meditation, and exercise and can be very helpful when they are practiced regularly. A mental health specialist can help determine which treatment is best for you. Some people see improvement with one therapy. However, other people require a combination of therapies. Document Released: 05/02/2012 Document Reviewed: 05/02/2012 [facility] Patient Information 2014 [place], [place]. Start celexa 1 daily Follow up in 1 month Get mammogram Continue megace

## 2013-04-26 NOTE — Progress Notes (Signed)
Patient ID: Gabrielle LinksSandy A Barrera, female   DOB: [birth year], 43 y.o.   MRN: [id number] History of Present Illness: Gabrielle CampanileSandy is a 43 year old white female in for a pap and physical, she has had AUB and is on megace and it has stopped.She is complaining of feeling anxious esp at work.Was seen in ER recently for UTI and itching was treated with keflex and prednisone and is better.   Current Medications, Allergies, Past Medical History, Past Surgical History, Family History and Social History were reviewed in [facility] electronic medical record.     Review of Systems: Patient denies any headaches, blurred vision, shortness of breath, chest pain, abdominal pain, problems with bowel movements, urination, or intercourse. No joint pain, see positives in HPI.    Physical Exam:BP 98/58  Pulse 72  Ht 5' 3.5" (1.613 m)  Wt 130 lb (58.968 kg)  BMI 22.66 kg/m2  LMP 02/25/2013 General:  Well developed, well nourished, no acute distress Skin:  Warm and dry, no rash or lesions seen Neck:  Midline trachea, normal thyroid Lungs; Clear to auscultation bilaterally Breast:  No dominant palpable mass, retraction, or nipple discharge Cardiovascular: Regular rate and rhythm Abdomen:  Soft, non tender, no hepatosplenomegaly Pelvic:  External genitalia is normal in appearance.  The vagina is normal in appearance. The cervix is bulbous. Pap with HPV performed. Uterus is felt to be normal size, shape, and contour.  No                adnexal masses or tenderness noted. Rectal: Good sphincter tone, no polyps, or hemorrhoids felt.  Hemoccult negative. Extremities:  No swelling or varicosities noted Psych:  Alert and cooperative, seems happy but is having anxiety, discussed meds and she wants to try something.   Impression: Yearly gyn exam  Anxiety Resolved AUB with megace    Plan: Continue megace Rx celexa 20 mg #30 1 daily with 6 refills Follow up in 1 month Review handout on anxiety Get mammogram

## 2013-04-27 ENCOUNTER — Telehealth: Payer: Self-pay | Admitting: Adult Health

## 2013-04-27 NOTE — Telephone Encounter (Signed)
Left message to call about pap 

## 2013-04-27 NOTE — Telephone Encounter (Signed)
Pt aware of pap and +HPV and need to repeat pap next year

## 2013-05-31 ENCOUNTER — Ambulatory Visit: Payer: BC Managed Care – PPO | Admitting: Adult Health

## 2013-07-12 ENCOUNTER — Ambulatory Visit: Payer: BC Managed Care – PPO | Admitting: Family Medicine

## 2013-11-20 ENCOUNTER — Encounter: Payer: Self-pay | Admitting: Adult Health

## 2014-05-24 ENCOUNTER — Ambulatory Visit: Payer: Self-pay | Admitting: Family Medicine

## 2015-01-06 ENCOUNTER — Emergency Department (HOSPITAL_COMMUNITY)
Admission: EM | Admit: 2015-01-06 | Discharge: 2015-01-07 | Disposition: A | Discharge status: Home / Self Care | Payer: Medicaid Other | Attending: Emergency Medicine | Admitting: Emergency Medicine

## 2015-01-06 ENCOUNTER — Emergency Department (HOSPITAL_COMMUNITY): Payer: Medicaid Other

## 2015-01-06 ENCOUNTER — Encounter (HOSPITAL_COMMUNITY): Payer: Self-pay | Admitting: Emergency Medicine

## 2015-01-06 DIAGNOSIS — Z8742 Personal history of other diseases of the female genital tract: Secondary | ICD-10-CM | POA: Insufficient documentation

## 2015-01-06 DIAGNOSIS — S3219XA Other fracture of sacrum, initial encounter for closed fracture: Secondary | ICD-10-CM | POA: Diagnosis not present

## 2015-01-06 DIAGNOSIS — Z7952 Long term (current) use of systemic steroids: Secondary | ICD-10-CM | POA: Diagnosis not present

## 2015-01-06 DIAGNOSIS — F17210 Nicotine dependence, cigarettes, uncomplicated: Secondary | ICD-10-CM | POA: Insufficient documentation

## 2015-01-06 DIAGNOSIS — W108XXA Fall (on) (from) other stairs and steps, initial encounter: Secondary | ICD-10-CM | POA: Insufficient documentation

## 2015-01-06 DIAGNOSIS — Y9389 Activity, other specified: Secondary | ICD-10-CM | POA: Insufficient documentation

## 2015-01-06 DIAGNOSIS — Z79899 Other long term (current) drug therapy: Secondary | ICD-10-CM | POA: Diagnosis not present

## 2015-01-06 DIAGNOSIS — Y9289 Other specified places as the place of occurrence of the external cause: Secondary | ICD-10-CM | POA: Diagnosis not present

## 2015-01-06 DIAGNOSIS — S0083XA Contusion of other part of head, initial encounter: Secondary | ICD-10-CM | POA: Diagnosis not present

## 2015-01-06 DIAGNOSIS — W19XXXA Unspecified fall, initial encounter: Secondary | ICD-10-CM

## 2015-01-06 DIAGNOSIS — S3210XA Unspecified fracture of sacrum, initial encounter for closed fracture: Secondary | ICD-10-CM

## 2015-01-06 DIAGNOSIS — F419 Anxiety disorder, unspecified: Secondary | ICD-10-CM | POA: Insufficient documentation

## 2015-01-06 DIAGNOSIS — Z792 Long term (current) use of antibiotics: Secondary | ICD-10-CM | POA: Insufficient documentation

## 2015-01-06 DIAGNOSIS — S3992XA Unspecified injury of lower back, initial encounter: Secondary | ICD-10-CM | POA: Diagnosis present

## 2015-01-06 DIAGNOSIS — Y998 Other external cause status: Secondary | ICD-10-CM | POA: Insufficient documentation
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
## 2015-01-07 MED ORDER — OXYCODONE-ACETAMINOPHEN 5-325 MG PO TABS
1.0000 | ORAL_TABLET | Freq: Once | ORAL | Status: AC
Start: 2015-01-07 — End: 2015-01-07
  Administered 2015-01-07: 1 via ORAL
  Filled 2015-01-07: qty 1

## 2015-01-07 MED ORDER — OXYCODONE-ACETAMINOPHEN 5-325 MG PO TABS: 1.0000 | ORAL_TABLET | ORAL | Status: DC | PRN

## 2015-01-07 MED ORDER — IBUPROFEN 800 MG PO TABS: 800.0000 mg | ORAL_TABLET | Freq: Three times a day (TID) | ORAL | Status: DC

## 2015-01-07 NOTE — Discharge Instructions (Signed)
Tailbone Injury The tailbone is the small bone at the lower end of the backbone (spine). You may have stretched tissues, bruises, or a broken bone (fracture). These injuries can be painful. Most tailbone injuries get better on their own in 4-6 weeks. HOME CARE  Take medicines only as told by your doctor.  If told, apply ice to the injured area.  Put ice in a plastic bag.  Place a towel between your skin and the bag.  Leave the ice on for 20 minutes, 2-3 times per day. Do this for the first 1-2 days.  Sit on a large, rubber or inflated ring or cushion to lessen pain. Lean forward when you sit to help lessen pain.  Avoid sitting in one place for a long time.  Increase your activity as the pain allows.  Do exercises as told by your doctor or physical therapist.  If it is painful to poop, take medicine to help you poop (stool softeners) as told by your doctor.  Eat foods that have plenty of fiber.  Keep all follow-up visits as told by your doctor. This is important. GET HELP IF:  Your pain gets worse.  Pooping causes you pain.  You cannot poop (constipation).  You are leaking pee (urinary incontinence).  You have a fever.   This information is not intended to replace advice given to you by your health care provider. Make sure you discuss any questions you have with your health care provider.   Document Released: 02/07/2010 Document Revised: 05/22/2014 Document Reviewed: 01/01/2014 Elsevier Interactive Patient Education 2016 [employer]  

## 2015-04-09 ENCOUNTER — Ambulatory Visit: Payer: Self-pay | Admitting: Family Medicine

## 2015-04-30 ENCOUNTER — Ambulatory Visit: Payer: Self-pay | Admitting: Family Medicine

## 2015-05-01 ENCOUNTER — Encounter: Payer: Self-pay | Admitting: Family Medicine

## 2015-05-13 ENCOUNTER — Ambulatory Visit: Payer: Self-pay | Admitting: Family Medicine

## 2015-06-18 ENCOUNTER — Telehealth: Payer: Self-pay | Admitting: Family Medicine

## 2015-06-18 NOTE — Telephone Encounter (Signed)
[facility].  She did not sign for certified letter, but her 30 days emergency care starts at last date of attempt to give to patient.  The last attempt was 05/20/15.

## 2015-07-15 ENCOUNTER — Emergency Department (HOSPITAL_COMMUNITY): Payer: Medicaid Other

## 2015-07-15 ENCOUNTER — Emergency Department (HOSPITAL_COMMUNITY)
Admission: EM | Admit: 2015-07-15 | Discharge: 2015-07-15 | Disposition: A | Discharge status: Home / Self Care | Payer: Medicaid Other | Attending: Emergency Medicine | Admitting: Emergency Medicine

## 2015-07-15 ENCOUNTER — Encounter (HOSPITAL_COMMUNITY): Payer: Self-pay | Admitting: Emergency Medicine

## 2015-07-15 DIAGNOSIS — S61233A Puncture wound without foreign body of left middle finger without damage to nail, initial encounter: Secondary | ICD-10-CM | POA: Insufficient documentation

## 2015-07-15 DIAGNOSIS — S61532A Puncture wound without foreign body of left wrist, initial encounter: Secondary | ICD-10-CM | POA: Diagnosis not present

## 2015-07-15 DIAGNOSIS — Y999 Unspecified external cause status: Secondary | ICD-10-CM | POA: Diagnosis not present

## 2015-07-15 DIAGNOSIS — Y929 Unspecified place or not applicable: Secondary | ICD-10-CM | POA: Insufficient documentation

## 2015-07-15 DIAGNOSIS — S41132A Puncture wound without foreign body of left upper arm, initial encounter: Secondary | ICD-10-CM | POA: Insufficient documentation

## 2015-07-15 DIAGNOSIS — W540XXA Bitten by dog, initial encounter: Secondary | ICD-10-CM | POA: Insufficient documentation

## 2015-07-15 DIAGNOSIS — Y939 Activity, unspecified: Secondary | ICD-10-CM | POA: Insufficient documentation

## 2015-07-15 DIAGNOSIS — F17210 Nicotine dependence, cigarettes, uncomplicated: Secondary | ICD-10-CM | POA: Diagnosis not present

## 2015-07-15 DIAGNOSIS — T148XXA Other injury of unspecified body region, initial encounter: Secondary | ICD-10-CM

## 2015-07-15 MED ORDER — OXYCODONE-ACETAMINOPHEN 5-325 MG PO TABS
1.0000 | ORAL_TABLET | Freq: Once | ORAL | Status: AC
  Administered 2015-07-15: 1 via ORAL
  Filled 2015-07-15: qty 1

## 2015-07-15 MED ORDER — LIDOCAINE-EPINEPHRINE (PF) 1 %-1:200000 IJ SOLN
30.0000 mL | Freq: Once | INTRAMUSCULAR | Status: AC
  Administered 2015-07-15: 30 mL via INTRADERMAL
  Filled 2015-07-15: qty 30

## 2015-07-15 MED ORDER — AMOXICILLIN-POT CLAVULANATE 875-125 MG PO TABS: 1.0000 | ORAL_TABLET | Freq: Two times a day (BID) | ORAL | Status: DC

## 2015-07-15 NOTE — ED Provider Notes (Signed)
CSN: [id number]     Arrival date & time 07/15/15  1210 History  By signing my name below, I, Soijett Blue, attest that this documentation has been prepared under the direction and in the presence of Bethel Born, PA-C Electronically Signed: Soijett Blue, ED Scribe. 07/15/2015. 1:01 PM.   Chief Complaint  Patient presents with  . Animal Bite    The history is provided by the patient. No language interpreter was used.    Gabrielle Barrera is a 45 y.o. female who presents to the Emergency Department complaining of an animal bite onset 10:30 AM today. Pt notes that she was attempting to break up a fight between her dog and another dog when her dog bit her on her left middle finger, left wrist, and left arm. Denies seeing any broken teeth following the encounter. Pt is unsure if her dog is UTD with his rabies injection, but notes that the last rabies vaccination was 2 years ago. Pt states that she is UTD with her tetanus at this time.  Pt is having associated symptoms of puncture wound to left middle finger/left wrist/left arm and left arm swelling. Pt reports that she has tried applying pressure to the for the relief of her symptoms. Pt denies any other symptoms.   Past Medical History  Diagnosis Date  . Abnormal uterine bleeding (AUB) 03/22/2013  . Hot flashes 03/22/2013  . Fatigue 03/22/2013  . Anxiety 04/26/2013   Past Surgical History  Procedure Laterality Date  . Tonsillectomy    . Cesarean section    . Ectopic pregnancy surgery    . Tubal ligation     Family History  Problem Relation Age of Onset  . Cancer Mother     skin   . Cancer Maternal Grandmother    Social History  Substance Use Topics  . Smoking status: Current Every Day Smoker -- 1.00 packs/day    Types: Cigarettes  . Smokeless tobacco: Never Used  . Alcohol Use: Yes     Comment: Occ   OB History    Gravida Para Term Preterm AB TAB SAB Ectopic Multiple Living   Review of Systems  Constitutional:  Negative for fever and chills.  Musculoskeletal: Positive for joint swelling and arthralgias (left wrist, left middle finger, left arm).  Skin: Positive for wound (animal bite to left middle finger, left wrist, and left arm).    Allergies  Review of patient's allergies indicates no known allergies.  Home Medications   Prior to Admission medications   Medication Sig Start Date End Date Taking? Authorizing Provider  ALPRAZolam Prudy Feeler) 1 MG tablet Take 0.5 mg by mouth daily as needed. For anxiety    Historical Provider, MD  cephALEXin (KEFLEX) 500 MG capsule Take 1 capsule (500 mg total) by mouth 4 (four) times daily. 04/14/13   Dione Booze, MD  citalopram (CELEXA) 20 MG tablet Take 1 tablet (20 mg total) by mouth daily. 04/26/13   Adline Potter, NP  ibuprofen (ADVIL,MOTRIN) 800 MG tablet Take 1 tablet (800 mg total) by mouth 3 (three) times daily. 01/07/15   Tammy Triplett, PA-C  megestrol (MEGACE) 40 MG tablet Take 1 daily 03/29/13   Adline Potter, NP  OVER THE COUNTER MEDICATION Take 1 tablet by mouth daily. Women's one a day multi vitamin    Historical Provider, MD  oxyCODONE-acetaminophen (PERCOCET/ROXICET) 5-325 MG tablet Take 1 tablet by mouth every 4 (four)  hours as needed. 01/07/15   Tammy Triplett, PA-C  predniSONE (DELTASONE) 50 MG tablet Take 1 tablet (50 mg total) by mouth daily. 04/14/13   Dione Boozeavid Glick, MD   BP 105/53 mmHg  Pulse 88  Temp(Src) 98 F (36.7 C) (Oral)  Resp 18  SpO2 98%   Physical Exam  Constitutional: She is oriented to person, place, and time. She appears well-developed and well-nourished. No distress.  HENT:  Head: Normocephalic and atraumatic.  Eyes: Conjunctivae are normal. Pupils are equal, round, and reactive to light. Right eye exhibits no discharge. Left eye exhibits no discharge. No scleral icterus.  Neck: Normal range of motion.  Cardiovascular: Normal rate.   Pulmonary/Chest: Effort normal. No respiratory distress.  Abdominal: She exhibits  no distension.  Neurological: She is alert and oriented to person, place, and time.  Skin: Skin is warm and dry.  2cm wound on right middle finger. 3 puncture wounds on dorsal aspect of left arm with moderate swelling  Psychiatric: She has a normal mood and affect. Her behavior is normal.  Nursing note and vitals reviewed.   ED Course  Procedures (including critical care time) DIAGNOSTIC STUDIES: Oxygen Saturation is 98% on RA, nl by my interpretation.    COORDINATION OF CARE: 12:59 PM Discussed treatment plan with pt at bedside which includes percocet, wound care, left hand xray, left forearm xray, abx Rx and pt agreed to plan.   Imaging Review Dg Forearm Left  07/15/2015  CLINICAL DATA:  Dog bite.  Left forearm injury. EXAM: LEFT FOREARM - 2 VIEW COMPARISON:  None. FINDINGS: There is gas throughout the posterior lateral soft tissues of the left forearm. No underlying bony abnormality. No radiopaque foreign body. IMPRESSION: Soft tissue gas within the forearm soft tissues. No radiopaque foreign body. No fracture. Electronically Signed   By: Charlett NoseKevin  Dover M.D.   On: 07/15/2015 13:24   Dg Hand Complete Left  07/15/2015  CLINICAL DATA:  Recent dog bite injury with laceration and pain, initial encounter EXAM: LEFT HAND - COMPLETE 3+ VIEW COMPARISON:  None. FINDINGS: No acute bony abnormality is noted. No radiopaque foreign body is seen. No significant soft tissue abnormality is noted. IMPRESSION: No acute abnormality seen. Electronically Signed   By: Alcide CleverMark  Lukens M.D.   On: 07/15/2015 13:25   I have personally reviewed and evaluated these images as part of my medical decision-making.   MDM   Final diagnoses:  Bite by animal   Patient presents with multiple puncture wounds to left forearm and left hand following a dog bite. Dog is UTD on rabies vaccination. Pt is UTD on tetanus immunization. Patient X-Ray negative for obvious fracture or dislocation. The wound is irrigated with normal  saline, debrided of foreign material as much as possible, and dressed. The patient is alerted to watch for any signs of infection (redness, pus, pain, increased swelling or fever) and call if such occurs. Home wound care instructions are provided. Tetanus vaccination status reviewed.Will discharge home with 7 day course of augmentin. Recommend ice and ibuprofen for pain and swelling at home. Also advised strict 24 hour follow up for wound check at either health dept, UC, or the ED. Patient will be discharged home & is agreeable with above plan. Returns precautions discussed. Pt appears safe for discharge.  I personally performed the services described in this documentation, which was scribed in my presence. The recorded information has been reviewed and is accurate.    Bethel BornKelly Marie Buford Bremer, PA-C 07/15/15 1432  Arby BarretteMarcy Pfeiffer,  MD 07/20/15 1045

## 2015-07-15 NOTE — ED Notes (Signed)
Pt presents to ED with puncture wounds to L middle finger and on L wrist and arm after breaking up a fight between her dog and another dog. Pt was bitten by her own dog. Pt unsure if dog is up to date on rabies shot but sts "I'm pretty sure she doesn't have rabies."  Pt sts the last time dog got a rabies shot was two years ago. A&Ox4 and ambulatory. Bleeding controlled.

## 2015-07-15 NOTE — ED Notes (Signed)
Patient transported to X-ray 

## 2015-12-26 ENCOUNTER — Ambulatory Visit: Payer: Medicaid Other | Admitting: Physical Therapy

## 2016-01-02 ENCOUNTER — Ambulatory Visit: Payer: Medicaid Other | Attending: Sports Medicine | Admitting: Physical Therapy

## 2016-01-02 DIAGNOSIS — M6281 Muscle weakness (generalized): Secondary | ICD-10-CM | POA: Insufficient documentation

## 2016-01-02 DIAGNOSIS — G8929 Other chronic pain: Secondary | ICD-10-CM | POA: Diagnosis present

## 2016-01-02 DIAGNOSIS — M545 Low back pain: Secondary | ICD-10-CM | POA: Diagnosis not present

## 2016-01-02 DIAGNOSIS — M546 Pain in thoracic spine: Secondary | ICD-10-CM | POA: Insufficient documentation

## 2016-01-02 NOTE — Patient Instructions (Signed)
Sleeping on Back  Place pillow under knees. A pillow with cervical support and a roll around waist are also helpful. Copyright  VHI. All rights reserved.  Sleeping on Side Place pillow between knees. Use cervical support under neck and a roll around waist as needed. Copyright  VHI. All rights reserved.   Sleeping on Stomach   If this is the only desirable sleeping position, place pillow under lower legs, and under stomach or chest as needed.  Posture - Sitting   Sit upright, head facing forward. Try using a roll to support lower back. Keep shoulders relaxed, and avoid rounded back. Keep hips level with knees. Avoid crossing legs for long periods. Stand to Sit / Sit to Stand   To sit: Bend knees to lower self onto front edge of chair, then scoot back on seat. To stand: Reverse sequence by placing one foot forward, and scoot to front of seat. Use rocking motion to stand up.   Work Height and Reach  Ideal work height is no more than 2 to 4 inches below elbow level when standing, and at elbow level when sitting. Reaching should be limited to arm's length, with elbows slightly bent.  Bending  Bend at hips and knees, not back. Keep feet shoulder-width apart.    Posture - Standing   Good posture is important. Avoid slouching and forward head thrust. Maintain curve in low back and align ears over shoul- ders, hips over ankles.  Alternating Positions   Alternate tasks and change positions frequently to reduce fatigue and muscle tension. Take rest breaks. Computer Work   Position work to face forward. Use proper work and seat height. Keep shoulders back and down, wrists straight, and elbows at right angles. Use chair that provides full back support. Add footrest and lumbar roll as needed.  Getting Into / Out of Car  Lower self onto seat, scoot back, then bring in one leg at a time. Reverse sequence to get out.  Dressing  Lie on back to pull socks or slacks over feet, or sit  and bend leg while keeping back straight.    Housework - Sink  Place one foot on ledge of cabinet under sink when standing at sink for prolonged periods.   Pushing / Pulling  Pushing is preferable to pulling. Keep back in proper alignment, and use leg muscles to do the work.  Deep Squat   Squat and lift with both arms held against upper trunk. Tighten stomach muscles without holding breath. Use smooth movements to avoid jerking.  Avoid Twisting   Avoid twisting or bending back. Pivot around using foot movements, and bend at knees if needed when reaching for articles.  Carrying Luggage   Distribute weight evenly on both sides. Use a cart whenever possible. Do not twist trunk. Move body as a unit.   Lifting Principles .Maintain proper posture and head alignment. .Slide object as close as possible before lifting. .Move obstacles out of the way. .Test before lifting; ask for help if too heavy. .Tighten stomach muscles without holding breath. .Use smooth movements; do not jerk. .Use legs to do the work, and pivot with feet. .Distribute the work load symmetrically and close to the center of trunk. .Push instead of pull whenever possible.   Ask For Help   Ask for help and delegate to others when possible. Coordinate your movements when lifting together, and maintain the low back curve.  Log Roll   Lying on back, bend left knee and place left   arm across chest. Roll all in one movement to the right. Reverse to roll to the left. Always move as one unit. Housework - Sweeping  Use long-handled equipment to avoid stooping.   Housework - Wiping  Position yourself as close as possible to reach work surface. Avoid straining your back.  Laundry - Unloading Wash   To unload small items at bottom of washer, lift leg opposite to arm being used to reach.  Gardening - Raking  Move close to area to be raked. Use arm movements to do the work. Keep back straight and avoid  twisting.     Cart  When reaching into cart with one arm, lift opposite leg to keep back straight.   Getting Into / Out of Bed  Lower self to lie down on one side by raising legs and lowering head at the same time. Use arms to assist moving without twisting. Bend both knees to roll onto back if desired. To sit up, start from lying on side, and use same move-ments in reverse. Housework - Vacuuming  Hold the vacuum with arm held at side. Step back and forth to move it, keeping head up. Avoid twisting.   Laundry - Loading Wash  Position laundry basket so that bending and twisting can be avoided.   Laundry - Unloading Dryer  Squat down to reach into clothes dryer or use a reacher.  Gardening - Weeding / Planting  Squat or Kneel. Knee pads may be helpful.                    Reducing Load   Copyright  VHI. All rights reserved.  BODY MECHANICS Tips Good body mechanics are important during activities of daily living. The practice of good body mechanics will: -help distribute weight throughout the skeleton in a more anatomically correct manner thus stimulating more normal forces on the bones, and encouraging stronger, healthier, denser bones. -reduce unnatural forces on bones, ligaments, joints and muscles and reduce risk of fracture, other injury or back pain. A WORD ON BODY POSITIONING: Sitting is the hardest position for the back. Lying on the back is the easiest. Standing, in good body alignment, is somewhere between. A good motto is: Sit less, stand more, and, when you can't do that, lie down on your back and exercise to strengthen it.  Copyright  VHI. All rights reserved.       Supine to Sit (Active)   Lie on back, left leg bent. Roll to other side. From side-lying, sit up on side of bed. Complete ___ sets of ___ repetitions. Perform ___ sessions per day.  Copyright  VHI. All rights reserved.    Housework - Reaching Down   If you are unable to  bend your knees or squat, use a lazy Susan to keep items within easy reach. Store only light, unbreakable items on the lowest shelves, and use a reacher to pick them up.  Copyright  VHI. All rights reserved.  Low Shelf   Squat down, and bring item close to lift.   Copyright  VHI. All rights reserved.  Lifting Principles .Maintain proper posture and head alignment. .Slide object as close as possible before lifting. .Move obstacles out of the way. .Test before lifting; ask for help if too heavy. .Tighten stomach muscles without holding breath. .Use smooth movements; do not jerk. .Use legs to do the work, and pivot with feet. .Distribute the work load symmetrically and close to the center of trunk. .Push instead of   pull whenever possible.  Copyright  VHI. All rights reserved.  Posture - Standing   Good posture is important. Avoid slouching and forward head thrust. Maintain curve in low back and align ears over shoul- ders, hips over ankles.   Copyright  VHI. All rights reserved.   Posture - Sitting   Sit upright, head facing forward. Try using a roll to support lower back. Keep shoulders relaxed, and avoid rounded back. Keep hips level with knees. Avoid crossing legs for long periods.   Copyright  VHI. All rights reserved.  Ideal Posture Use with figures on 3 (2 of 2): 1.Head erect 2.Chin in 3.Chest and navel aligned 4.Spinal curves maintained 5.Knees relaxed 6.Shoulders and hips aligned 7.Feet slightly apart 8.Toes and arches active 9.Abdomen taut (breathe with diaphragm) 10.Arms at sides Ideal posture is: -pain free. -achieved with practice, mindful interest, and body awareness.  Copyright  VHI. All rights reserved.     Move heavy items one at a time, or move portions of the contents.   Posture Awareness     Stand and check posture: Jut chin, pull back to comfortable position. Tilt pelvis forward, back; be sure back is not swayed. Roll from heels to balls  of feet, then distribute your weight evenly. Picture a line through spine pulling you erect. Focus on breathing. Good Posture = Better Breathing. Check ____ times per day.  http://gt2.exer.us/873   Copyright  VHI. All rights reserved.   

## 2016-01-02 NOTE — Therapy (Addendum)
[facility] [address] [place], [place], [address] Phone: [phone]   Fax:  [phone]  Physical Therapy Evaluation/Discharge  Patient Details  Name: Gabrielle LinksSandy A Herald MRN: [id number] Date of Birth: 03/29/1970 Referring Provider: Dr. Rodolph BongAdam Kendall   Encounter Date: 01/02/2016      PT End of Session - 01/02/16 1533    Visit Number 1   Number of Visits 1   PT Start Time 1452   PT Stop Time 1535   PT Time Calculation (min) 43 min   Activity Tolerance Patient tolerated treatment well   Behavior During Therapy [facility] for tasks assessed/performed      Past Medical History:  Diagnosis Date  . Abnormal uterine bleeding (AUB) 03/22/2013  . Anxiety 04/26/2013  . Fatigue 03/22/2013  . Hot flashes 03/22/2013    Past Surgical History:  Procedure Laterality Date  . CESAREAN SECTION    . ECTOPIC PREGNANCY SURGERY    . TONSILLECTOMY    . TUBAL LIGATION      There were no vitals filed for this visit.       Subjective Assessment - 01/02/16 1458    Subjective For about a year [name] has been having pain in middle and lower back.  She has difficulty standing or sitting too long, doing repetitive tasks (physical work), getting up from a squatting or stopped position.  She feels pain and strain in low back with heavier household tasks.  Denies sensory sx , and LE weakness.     Pertinent History sacral fracture 2016   Limitations Sitting;Lifting;Standing;House hold activities   How long can you sit comfortably? depending on chair 30-60 min    Diagnostic tests XR done showed L4-L5 DDD    Patient Stated Goals Patient wants to be pain free.    Currently in Pain? Yes   Pain Score 3    Pain Location Back   Pain Orientation Mid;Lower   Pain Descriptors / Indicators Aching   Pain Type Chronic pain   Pain Onset More than a month ago   Pain Frequency Intermittent   Pain Relieving Factors meds, laying down, has not used heat or ice, walking does not  increase    Multiple Pain Sites No            [facility] PT Assessment - 01/02/16 1508      Assessment   Medical Diagnosis mid back and low back pain    Referring Provider Dr. Rodolph BongAdam Kendall    Onset Date/Surgical Date --  chronic    Prior Therapy Yes     Precautions   Precautions None     Restrictions   Weight Bearing Restrictions No     Balance Screen   Has the patient fallen in the past 6 months No     Home Environment   Living Environment Private residence   Living Arrangements Parent     Prior Function   Level of Independence Independent   Vocation Unemployed   Leisure playing with her dog      Cognition   Overall Cognitive Status Within Functional Limits for tasks assessed     Observation/Other Assessments   Focus on Therapeutic Outcomes (FOTO)  NT     Sensation   Light Touch Appears Intact     Coordination   Gross Motor Movements are Fluid and Coordinated Not tested     AROM   Lumbar Flexion WFL with post thigh stretch    Lumbar Extension pain central 25% limit  Lumbar - Right Side Bend [facility]   Lumbar - Left Side Bend WFL   Lumbar - Right Rotation WFL  pain    Lumbar - Left Rotation WFL  pain      Strength   Right Hip Flexion 4/5   Left Hip Flexion 4/5   Right Knee Flexion 5/5   Right Knee Extension 5/5   Left Knee Flexion 5/5   Left Knee Extension 5/5                           PT Education - 01/02/16 1532    Education provided Yes   Education Details PT/POC, HEP , basic back , posture and lifting    Person(s) Educated Patient   Methods Explanation;Demonstration;Handout   Comprehension Verbalized understanding;Returned demonstration;Need further instruction          PT Short Term Goals - 01/02/16 1544      PT SHORT TERM GOAL #1   Title Pt will be given HEP and information on posture, lifting and body mechanics.    Time 1   Period Days   Status Achieved           PT Long Term Goals - 01/02/16 1544      PT  LONG TERM GOAL #1   Title NA               Plan - 01/02/16 1541    Clinical Impression Statement Pt with low complexity eval of chronic low and middle back pain.  Presentation is consistent with degenerative process. SHe has good strength in LEs, but is weak in lower abdominals.  Hamstrings are tight.  Good spinal mobility.  She was given a basic HEP for general back pain, as well as extensive material about posture, lifting and resources for continued therapy intevention.  [facility]  does not cover PT for this diagnosis, although she may benefit from corrective exercise and education.    Rehab Potential Excellent   PT Frequency One time visit   PT Treatment/Interventions ADLs/Self Care Home Management;Therapeutic exercise;Patient/family education   PT Next Visit Plan NA   PT Home Exercise Plan basic back: lower abs/stabilization, hamstring, LTR, S KTC, pelvic tilt, bridging    Consulted and Agree with Plan of Care Patient      Patient will benefit from skilled therapeutic intervention in order to improve the following deficits and impairments:  Pain, Impaired flexibility, Decreased strength  Visit Diagnosis: Chronic right-sided low back pain without sciatica  Pain in thoracic spine  Muscle weakness (generalized)     Problem List Patient Active Problem List   Diagnosis Date Noted  . Anxiety 04/26/2013  . Abnormal uterine bleeding (AUB) 03/22/2013  . Hot flashes 03/22/2013  . Fatigue 03/22/2013    Aidon Klemens 01/02/2016, 4:32 PM  [facility] [address] [place], [place], [address] Phone: [phone]   Fax:  [phone]  Name: Gabrielle LinksSandy A Weiss MRN: [id number] Date of Birth: [birth year]  Karie MainlandJennifer Alyric Parkin, PT 01/02/16 4:32 PM Phone: [phone] Fax: [phone]

## 2016-09-20 ENCOUNTER — Emergency Department (HOSPITAL_COMMUNITY): Payer: No Typology Code available for payment source

## 2016-09-20 ENCOUNTER — Emergency Department (HOSPITAL_COMMUNITY)
Admission: EM | Admit: 2016-09-20 | Discharge: 2016-09-20 | Disposition: A | Discharge status: Home / Self Care | Payer: No Typology Code available for payment source | Attending: Emergency Medicine | Admitting: Emergency Medicine

## 2016-09-20 ENCOUNTER — Encounter (HOSPITAL_COMMUNITY): Payer: Self-pay | Admitting: *Deleted

## 2016-09-20 DIAGNOSIS — M545 Low back pain, unspecified: Secondary | ICD-10-CM

## 2016-09-20 DIAGNOSIS — M25551 Pain in right hip: Secondary | ICD-10-CM | POA: Insufficient documentation

## 2016-09-20 DIAGNOSIS — F17210 Nicotine dependence, cigarettes, uncomplicated: Secondary | ICD-10-CM | POA: Diagnosis not present

## 2016-09-20 DIAGNOSIS — Z79899 Other long term (current) drug therapy: Secondary | ICD-10-CM | POA: Insufficient documentation

## 2016-09-20 MED ORDER — CYCLOBENZAPRINE HCL 5 MG PO TABS: 5.0000 mg | ORAL_TABLET | Freq: Three times a day (TID) | ORAL | 0 refills | Status: AC | PRN

## 2016-09-20 MED ORDER — IBUPROFEN 600 MG PO TABS: 600.0000 mg | ORAL_TABLET | Freq: Four times a day (QID) | ORAL | 0 refills | Status: AC | PRN
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
## 2016-10-02 ENCOUNTER — Emergency Department (HOSPITAL_COMMUNITY)
Admission: EM | Admit: 2016-10-02 | Discharge: 2016-10-02 | Disposition: A | Discharge status: Home / Self Care | Payer: Self-pay | Attending: Emergency Medicine | Admitting: Emergency Medicine

## 2016-10-02 ENCOUNTER — Encounter (HOSPITAL_COMMUNITY): Payer: Self-pay | Admitting: Emergency Medicine

## 2016-10-02 ENCOUNTER — Emergency Department (HOSPITAL_COMMUNITY): Payer: Self-pay

## 2016-10-02 DIAGNOSIS — Z79899 Other long term (current) drug therapy: Secondary | ICD-10-CM | POA: Insufficient documentation

## 2016-10-02 DIAGNOSIS — R1030 Lower abdominal pain, unspecified: Secondary | ICD-10-CM

## 2016-10-02 DIAGNOSIS — F17210 Nicotine dependence, cigarettes, uncomplicated: Secondary | ICD-10-CM | POA: Insufficient documentation

## 2016-10-02 LAB — URINALYSIS, ROUTINE W REFLEX MICROSCOPIC
Bacteria, UA: NONE SEEN
Bilirubin Urine: NEGATIVE
GLUCOSE, UA: NEGATIVE mg/dL
KETONES UR: NEGATIVE mg/dL
LEUKOCYTES UA: NEGATIVE
NITRITE: NEGATIVE
Protein, ur: NEGATIVE mg/dL
Specific Gravity, Urine: 1.015 (ref 1.005–1.030)
pH: 8 (ref 5.0–8.0)

## 2016-10-02 LAB — COMPREHENSIVE METABOLIC PANEL
ALBUMIN: 4 g/dL (ref 3.5–5.0)
ALK PHOS: 57 U/L (ref 38–126)
ALT: 14 U/L (ref 14–54)
AST: 19 U/L (ref 15–41)
Anion gap: 7 (ref 5–15)
BUN: 19 mg/dL (ref 6–20)
CALCIUM: 9.1 mg/dL (ref 8.9–10.3)
CHLORIDE: 105 mmol/L (ref 101–111)
CO2: 24 mmol/L (ref 22–32)
CREATININE: 0.97 mg/dL (ref 0.44–1.00)
GFR calc non Af Amer: >60 mL/min (ref >60)
GLUCOSE: 97 mg/dL (ref 65–99)
Potassium: 4.3 mmol/L (ref 3.5–5.1)
SODIUM: 136 mmol/L (ref 135–145)
Total Bilirubin: 0.3 mg/dL (ref 0.3–1.2)
Total Protein: 7 g/dL (ref 6.5–8.1)

## 2016-10-02 LAB — CBC
HCT: 41.8 % (ref 36.0–46.0)
Hemoglobin: 14 g/dL (ref 12.0–15.0)
MCH: 32 pg (ref 26.0–34.0)
MCHC: 33.5 g/dL (ref 30.0–36.0)
MCV: 95.7 fL (ref 78.0–100.0)
Platelets: 229 10*3/uL (ref 150–400)
RBC: 4.37 MIL/uL (ref 3.87–5.11)
RDW: 13.4 % (ref 11.5–15.5)
WBC: 8.2 10*3/uL (ref 4.0–10.5)

## 2016-10-02 LAB — LIPASE, BLOOD: Lipase: 43 U/L (ref 11–51)

## 2016-10-02 NOTE — ED Triage Notes (Signed)
Pt c/o intermittent right side abd pain with nausea x 2 days. Denies v/d.
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
## 2016-10-02 NOTE — ED Notes (Signed)
Pt c/o fatigue "all the time"

## 2016-10-02 NOTE — Discharge Instructions (Signed)
Follow up with dr. Darrick Penna if not improving.   Motrin for pain

## 2017-01-19 DEATH — deceased
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
